# Patient Record
Sex: Male | Born: 1995 | Race: Black or African American | Hispanic: No | Marital: Single
Health system: Southern US, Community
[De-identification: ages and names within clinical notes are randomized; demographics above are authoritative.]

---

## 2021-02-02 ENCOUNTER — Emergency Department: Payer: Self-pay

## 2021-02-02 ENCOUNTER — Emergency Department
Admission: EM | Admit: 2021-02-02 | Discharge: 2021-02-02 | Disposition: A | Discharge status: Home / Self Care | Payer: Self-pay | Attending: Emergency Medicine | Admitting: Emergency Medicine

## 2021-02-02 ENCOUNTER — Other Ambulatory Visit: Payer: Self-pay

## 2021-02-02 DIAGNOSIS — R079 Chest pain, unspecified: Secondary | ICD-10-CM

## 2021-02-02 DIAGNOSIS — R7989 Other specified abnormal findings of blood chemistry: Secondary | ICD-10-CM | POA: Insufficient documentation

## 2021-02-02 DIAGNOSIS — R0789 Other chest pain: Secondary | ICD-10-CM | POA: Insufficient documentation

## 2021-02-02 DIAGNOSIS — R0602 Shortness of breath: Secondary | ICD-10-CM | POA: Insufficient documentation

## 2021-02-02 DIAGNOSIS — Z20822 Contact with and (suspected) exposure to covid-19: Secondary | ICD-10-CM | POA: Insufficient documentation

## 2021-02-02 LAB — BASIC METABOLIC PANEL
Anion gap: 12 (ref 5–15)
BUN: 10 mg/dL (ref 6–20)
CO2: 23 mmol/L (ref 22–32)
Calcium: 9.5 mg/dL (ref 8.9–10.3)
Chloride: 108 mmol/L (ref 98–111)
Creatinine, Ser: 1.26 mg/dL — ABNORMAL HIGH (ref 0.61–1.24)
GFR, Estimated: >60 mL/min (ref >60)
Glucose, Bld: 114 mg/dL — ABNORMAL HIGH (ref 70–99)
Potassium: 3.7 mmol/L (ref 3.5–5.1)
Sodium: 143 mmol/L (ref 135–145)

## 2021-02-02 LAB — TROPONIN I (HIGH SENSITIVITY)
Troponin I (High Sensitivity): 40 ng/L — ABNORMAL HIGH (ref <18)
Troponin I (High Sensitivity): 43 ng/L — ABNORMAL HIGH (ref <18)
Troponin I (High Sensitivity): 44 ng/L — ABNORMAL HIGH (ref <18)

## 2021-02-02 LAB — CBC
HCT: 42.5 % (ref 39.0–52.0)
Hemoglobin: 13.1 g/dL (ref 13.0–17.0)
MCH: 22.8 pg — ABNORMAL LOW (ref 26.0–34.0)
MCHC: 30.8 g/dL (ref 30.0–36.0)
MCV: 74 fL — ABNORMAL LOW (ref 80.0–100.0)
Platelets: 280 10*3/uL (ref 150–400)
RBC: 5.74 MIL/uL (ref 4.22–5.81)
RDW: 16.3 % — ABNORMAL HIGH (ref 11.5–15.5)
WBC: 6.4 10*3/uL (ref 4.0–10.5)
nRBC: 0 % (ref 0.0–0.2)

## 2021-02-02 LAB — RESP PANEL BY RT-PCR (FLU A&B, COVID) ARPGX2
Influenza A by PCR: NEGATIVE
Influenza B by PCR: NEGATIVE
SARS Coronavirus 2 by RT PCR: NEGATIVE

## 2021-02-02 MED ORDER — IOHEXOL 350 MG/ML SOLN
75.0000 mL | Freq: Once | INTRAVENOUS | Status: AC | PRN
  Administered 2021-02-02: 75 mL via INTRAVENOUS

## 2021-02-02 MED ORDER — ASPIRIN 81 MG PO CHEW
324.0000 mg | CHEWABLE_TABLET | Freq: Once | ORAL | Status: AC
  Administered 2021-02-02: 324 mg via ORAL
  Filled 2021-02-02: qty 4

## 2021-02-02 NOTE — ED Triage Notes (Signed)
Pt to ED via EMS from home, pt states aprox 1 hour ago he began to have chest pain on the left side that was sharp when he took a deep breath. Pt states he was also somewhat short of breath. Pt denies cardiac hx

## 2021-02-02 NOTE — ED Notes (Signed)
Morning assessment: pt rested comfortably overnight, denies current chest pain, c/o slight SOB and SPO2 remains 99-100%. RR: 12-16 bpm. Pt reports some occasional coughing overnight and this am. Pt ate [place] sandwich/food tray provided overnight.

## 2021-02-02 NOTE — ED Provider Notes (Signed)
-----------------------------------------   9:54 AM on 02/02/2021 ----------------------------------------- Patient's troponin is largely unchanged once again.  I spoke to cardiology Dr. Azucena Cecil who will follow up with the patient as an outpatient.  I have given the patient referral phone numbers that he is to call to arrange the appointment.  Patient's COVID/flu is negative.  Overall patient appears well.  Denies any symptoms currently.   Minna Antis, MD 02/02/21 [phone]

## 2021-02-02 NOTE — Discharge Instructions (Signed)
As we discussed please call the number provided to arrange a cardiology follow-up appointment.  Return to the emergency department for any return of/worsening chest pain, trouble breathing, or any other symptom personally concerning to yourself.

## 2021-02-02 NOTE — ED Provider Notes (Signed)
[facility] Emergency Department Provider Note ____________________________________________   Event Date/Time   First MD Initiated Contact with Patient 02/02/21 [phone]     (approximate)  I have reviewed the triage vital signs and the nursing notes.  HISTORY  Chief Complaint Chest Pain   HPI Shawn Horn is a 25 y.o. malewho presents to the ED for evaluation of chest pain.   Chart review indicates ED visit at a [facility] about 3 weeks ago for chest pain, benign work-up and discharged with NSAIDs.  Patient presents to the ED for evaluation of about 24 hours of intermittent chest pain.  He reports left-sided chest pain that is worse with inspiration and reports feeling mild shortness of breath associated with this, because he cannot take deep breaths due to the pain.  Denies any cough, productive cough, fever, syncopal episodes, abdominal pain, emesis or trauma.  Denies rash or skin changes.  Reports no chest pain right now.  Patient reports that he is undomiciled.  Denies recreational drug use or IVDU.  History reviewed. No pertinent past medical history.  There are no problems to display for this patient.   History reviewed. No pertinent surgical history.  Prior to Admission medications   Not on File    Allergies Patient has no known allergies.  No family history on file.  Social History    Review of Systems  Constitutional: No fever/chills Eyes: No visual changes. ENT: No sore throat. Cardiovascular: Positive for chest pain. Respiratory: Positive for shortness of breath. Gastrointestinal: No abdominal pain.  No nausea, no vomiting.  No diarrhea.  No constipation. Genitourinary: Negative for dysuria. Musculoskeletal: Negative for back pain. Skin: Negative for rash. Neurological: Negative for headaches, focal weakness or numbness.  ____________________________________________   PHYSICAL EXAM:  VITAL SIGNS: Vitals:   02/02/21  0539 02/02/21 0600  BP: 109/73 120/75  Pulse: 82 80  Resp: 14 14  Temp:    SpO2: 98% 99%    Constitutional: Alert and oriented. Well appearing and in no acute distress. Eyes: Conjunctivae are normal. PERRL. EOMI. Head: Atraumatic. Nose: No congestion/rhinnorhea. Mouth/Throat: Mucous membranes are moist.  Oropharynx non-erythematous. Neck: No stridor. No cervical spine tenderness to palpation. Cardiovascular: Normal rate, regular rhythm. Grossly normal heart sounds.  Good peripheral circulation. Respiratory: Normal respiratory effort.  No retractions. Lungs CTAB. Gastrointestinal: Soft , nondistended, nontender to palpation. No CVA tenderness. Musculoskeletal: No lower extremity tenderness nor edema.  No joint effusions. No signs of acute trauma. Chest pain is not reproducible on palpation.  No skin changes or signs of trauma. Neurologic:  Normal speech and language. No gross focal neurologic deficits are appreciated. No gait instability noted. Skin:  Skin is warm, dry and intact. No rash noted. Psychiatric: Mood and affect are normal. Speech and behavior are normal. ____________________________________________   LABS (all labs ordered are listed, but only abnormal results are displayed)  Labs Reviewed  BASIC METABOLIC PANEL - Abnormal; Notable for the following components:      Result Value   Glucose, Bld 114 (*)    Creatinine, Ser 1.26 (*)    All other components within normal limits  CBC - Abnormal; Notable for the following components:   MCV 74.0 (*)    MCH 22.8 (*)    RDW 16.3 (*)    All other components within normal limits  TROPONIN I (HIGH SENSITIVITY) - Abnormal; Notable for the following components:   Troponin I (High Sensitivity) 40 (*)    All other components within normal  limits  TROPONIN I (HIGH SENSITIVITY) - Abnormal; Notable for the following components:   Troponin I (High Sensitivity) 43 (*)    All other components within normal limits    ____________________________________________  12 Lead EKG  Sinus rhythm, rate of 90 bpm.  Normal axis and intervals.  Stigmata of LVH.  Nonspecific ST changes laterally and inferiorly without STEMI.  No comparison. ____________________________________________  RADIOLOGY  ED MD interpretation:  CXR reviewed by me without evidence of acute cardiopulmonary pathology.  Official radiology report(s): DG Chest 2 View  Result Date: 02/02/2021 CLINICAL DATA:  Chest pain on the left EXAM: CHEST - 2 VIEW COMPARISON:  None. FINDINGS: The heart size and mediastinal contours are within normal limits. Both lungs are clear. The visualized skeletal structures are unremarkable. IMPRESSION: No active cardiopulmonary disease. Electronically Signed   By: Tiburcio Pea M.D.   On: 02/02/2021 04:08   CT Angio Chest PE W and/or Wo Contrast  Result Date: 02/02/2021 CLINICAL DATA:  Chest pain, pleuritic EXAM: CT ANGIOGRAPHY CHEST WITH CONTRAST TECHNIQUE: Multidetector CT imaging of the chest was performed using the standard protocol during bolus administration of intravenous contrast. Multiplanar CT image reconstructions and MIPs were obtained to evaluate the vascular anatomy. CONTRAST:  48mL OMNIPAQUE IOHEXOL 350 MG/ML SOLN COMPARISON:  None. FINDINGS: Cardiovascular: Satisfactory opacification of the pulmonary arteries to the segmental level. No evidence of pulmonary embolism. Normal heart size. No pericardial effusion. Mediastinum/Nodes: Residual thymus with normal size and shape for age. No adenopathy or mass Lungs/Pleura: Cavitary nodule in the left lower lobe measuring 6 mm, solitary. Upper Abdomen: Negative Musculoskeletal: Gynecomastia.  Negative for fracture or bone lesion Review of the MIP images confirms the above findings. IMPRESSION: 1. Negative for pulmonary embolism. 2. 6 mm cavitary nodule in the left lower lobe, nonspecific in isolation. Fleischner follow-up criteria do not apply to patients of this  age. Fortunately, in retrospect a nodule is seen in this location on preceding chest x-ray, recommend radiographic follow-up. Electronically Signed   By: Tiburcio Pea M.D.   On: 02/02/2021 06:38    ____________________________________________   PROCEDURES and INTERVENTIONS  Procedure(s) performed (including Critical Care):  .1-3 Lead EKG Interpretation Performed by: Delton Prairie, MD Authorized by: Delton Prairie, MD     Interpretation: normal     ECG rate:  76   ECG rate assessment: normal     Rhythm: sinus rhythm     Ectopy: none     Conduction: normal    Medications  aspirin chewable tablet 324 mg (324 mg Oral Given 02/02/21 0604)  iohexol (OMNIPAQUE) 350 MG/ML injection 75 mL (75 mLs Intravenous Contrast Given 02/02/21 0621)    ____________________________________________   MDM / ED COURSE   Undomiciled 25 year old male presents to the ED with atypical chest pains and marginally elevated troponins.  Normal vitals on room air.  He looks clinically well with a normal examination.  No signs of trauma, neurologic or vascular deficits.  Unable to reproduce chest pain on palpation.  His EKG has nonspecific ST changes laterally and inferiorly, but no comparison.  Certainly no STEMI.  Basic labs are unremarkable, but his troponin is slightly elevated with no comparison in our system.  Second troponin is essentially flat.  CXR unremarkable and CTA chest without evidence of acute PE.  Has no chest pain in the ED.  Due to his surprisingly elevated troponin, we will send for third to better evaluate any significant changes or trend.  If this is normal or flat, then anticipate  he be suitable for outpatient management.  Patient signed out to oncoming provider.  Clinical Course as of 02/02/21 0656  Wed Feb 02, 2021  0156 Reassessed.  Patient reports feeling better.  Has tolerated a full meal tray without abdominal pain or emesis.  We discussed reassuring CT imaging and basic labs.  We discussed  a third troponin to ensure no significant changes.  He is agreeable. [DS]    Clinical Course User Index [DS] Delton Prairie, MD    ____________________________________________   FINAL CLINICAL IMPRESSION(S) / ED DIAGNOSES  Final diagnoses:  Other chest pain     ED Discharge Orders     None        Shawn Horn   Note:  This document was prepared using Dragon voice recognition software and may include unintentional dictation errors.    Delton Prairie, MD 02/02/21 [phone]

## 2021-02-02 NOTE — ED Notes (Signed)
Pt given sandwich tray 

## 2021-05-13 ENCOUNTER — Emergency Department (HOSPITAL_COMMUNITY)
Admission: EM | Admit: 2021-05-13 | Discharge: 2021-05-14 | Disposition: A | Discharge status: Home / Self Care | Payer: Commercial Managed Care - PPO | Attending: Emergency Medicine | Admitting: Emergency Medicine

## 2021-05-13 ENCOUNTER — Other Ambulatory Visit: Payer: Self-pay

## 2021-05-13 ENCOUNTER — Encounter (HOSPITAL_COMMUNITY): Payer: Self-pay

## 2021-05-13 DIAGNOSIS — F10129 Alcohol abuse with intoxication, unspecified: Secondary | ICD-10-CM | POA: Insufficient documentation

## 2021-05-13 DIAGNOSIS — Z20822 Contact with and (suspected) exposure to covid-19: Secondary | ICD-10-CM | POA: Diagnosis not present

## 2021-05-13 DIAGNOSIS — R45851 Suicidal ideations: Secondary | ICD-10-CM | POA: Diagnosis not present

## 2021-05-13 DIAGNOSIS — F10920 Alcohol use, unspecified with intoxication, uncomplicated: Secondary | ICD-10-CM

## 2021-05-13 LAB — CBC WITH DIFFERENTIAL/PLATELET
Abs Immature Granulocytes: 0.03 10*3/uL (ref 0.00–0.07)
Basophils Absolute: 0 10*3/uL (ref 0.0–0.1)
Basophils Relative: 1 %
Eosinophils Absolute: 0.3 10*3/uL (ref 0.0–0.5)
Eosinophils Relative: 5 %
HCT: 44.6 % (ref 39.0–52.0)
Hemoglobin: 13.9 g/dL (ref 13.0–17.0)
Immature Granulocytes: 1 %
Lymphocytes Relative: 46 %
Lymphs Abs: 2.9 10*3/uL (ref 0.7–4.0)
MCH: 22.7 pg — ABNORMAL LOW (ref 26.0–34.0)
MCHC: 31.2 g/dL (ref 30.0–36.0)
MCV: 72.8 fL — ABNORMAL LOW (ref 80.0–100.0)
Monocytes Absolute: 0.2 10*3/uL (ref 0.1–1.0)
Monocytes Relative: 3 %
Neutro Abs: 2.7 10*3/uL (ref 1.7–7.7)
Neutrophils Relative %: 44 %
Platelets: 274 10*3/uL (ref 150–400)
RBC: 6.13 MIL/uL — ABNORMAL HIGH (ref 4.22–5.81)
RDW: 16.6 % — ABNORMAL HIGH (ref 11.5–15.5)
WBC: 6.1 10*3/uL (ref 4.0–10.5)
nRBC: 0 % (ref 0.0–0.2)

## 2021-05-13 LAB — RAPID URINE DRUG SCREEN, HOSP PERFORMED
Amphetamines: NOT DETECTED
Barbiturates: NOT DETECTED
Benzodiazepines: NOT DETECTED
Cocaine: NOT DETECTED
Opiates: NOT DETECTED
Tetrahydrocannabinol: NOT DETECTED

## 2021-05-13 LAB — COMPREHENSIVE METABOLIC PANEL
ALT: 51 U/L — ABNORMAL HIGH (ref 0–44)
AST: 42 U/L — ABNORMAL HIGH (ref 15–41)
Albumin: 5.4 g/dL — ABNORMAL HIGH (ref 3.5–5.0)
Alkaline Phosphatase: 74 U/L (ref 38–126)
Anion gap: 11 (ref 5–15)
BUN: 9 mg/dL (ref 6–20)
CO2: 26 mmol/L (ref 22–32)
Calcium: 9.9 mg/dL (ref 8.9–10.3)
Chloride: 102 mmol/L (ref 98–111)
Creatinine, Ser: 1.05 mg/dL (ref 0.61–1.24)
GFR, Estimated: >60 mL/min (ref >60)
Glucose, Bld: 112 mg/dL — ABNORMAL HIGH (ref 70–99)
Potassium: 3.8 mmol/L (ref 3.5–5.1)
Sodium: 139 mmol/L (ref 135–145)
Total Bilirubin: 0.6 mg/dL (ref 0.3–1.2)
Total Protein: 9.2 g/dL — ABNORMAL HIGH (ref 6.5–8.1)

## 2021-05-13 LAB — ETHANOL: Alcohol, Ethyl (B): 328 mg/dL (ref <10)

## 2021-05-13 LAB — ACETAMINOPHEN LEVEL: Acetaminophen (Tylenol), Serum: <10 ug/mL — ABNORMAL LOW (ref 10–30)

## 2021-05-13 LAB — SALICYLATE LEVEL: Salicylate Lvl: <7 mg/dL — ABNORMAL LOW (ref 7.0–30.0)

## 2021-05-13 NOTE — ED Notes (Signed)
Date and time results received: 05/13/21 2258  Test: ETOH Critical Value: 328  Name of Provider Notified: Abigail  Orders Received? Or Actions Taken?: N/A

## 2021-05-13 NOTE — ED Notes (Signed)
Patient has 1 patient belonging bag and 1 back pack. Patient changed out into burgandy scrubs.

## 2021-05-13 NOTE — ED Provider Triage Note (Signed)
Emergency Medicine Provider Triage Evaluation Note  Shawn Horn , a 26 y.o. male  was evaluated in triage.  Pt complains of SI. No plan. Denies HI. Admits to some hallucinations. Patient admits to drinking 2 beers tonight. No drugs. Per patient he was recently on Prozac, but stopped taking it and is requesting to be put back on it.  Review of Systems  Positive: SI Negative: CP  Physical Exam  Ht 5\' 9"  (1.753 m)    Wt 77 kg    BMI 25.07 kg/m  Gen:   Awake, no distress   Resp:  Normal effort  MSK:   Moves extremities without difficulty  Other:    Medical Decision Making  Medically screening exam initiated at 9:59 PM.  Appropriate orders placed.  Shawn Horn was informed that the remainder of the evaluation will be completed by another provider, this initial triage assessment does not replace that evaluation, and the importance of remaining in the ED until their evaluation is complete.  Medical clearance labs   UTMLY 05/13/21 2200

## 2021-05-13 NOTE — ED Triage Notes (Signed)
[facility]. Patient stopped psych meds 2 weeks ago. Came from [place] today and has no place to stay. Patient is HI/SI did not mention any plans. Only remembers one medication being prozac, cannot remember the other 2. Patient stopped taking them because he did not like the way it made him feel. Patient also took 2 beers.

## 2021-05-14 LAB — RESP PANEL BY RT-PCR (FLU A&B, COVID) ARPGX2
Influenza A by PCR: NEGATIVE
Influenza B by PCR: NEGATIVE
SARS Coronavirus 2 by RT PCR: NEGATIVE

## 2021-05-14 MED ORDER — ZOLPIDEM TARTRATE 5 MG PO TABS: 5.0000 mg | ORAL_TABLET | Freq: Every evening | ORAL | Status: DC | PRN

## 2021-05-14 MED ORDER — ALUM & MAG HYDROXIDE-SIMETH 200-200-20 MG/5ML PO SUSP: 30.0000 mL | Freq: Four times a day (QID) | ORAL | Status: DC | PRN

## 2021-05-14 MED ORDER — BENZTROPINE MESYLATE 1 MG PO TABS
1.0000 mg | ORAL_TABLET | Freq: Four times a day (QID) | ORAL | Status: DC | PRN
  Filled 2021-05-14: qty 1

## 2021-05-14 MED ORDER — HALOPERIDOL 5 MG PO TABS
5.0000 mg | ORAL_TABLET | Freq: Four times a day (QID) | ORAL | Status: DC | PRN
  Filled 2021-05-14: qty 1

## 2021-05-14 MED ORDER — ACETAMINOPHEN 325 MG PO TABS: 650.0000 mg | ORAL_TABLET | ORAL | Status: DC | PRN

## 2021-05-14 MED ORDER — ONDANSETRON HCL 4 MG PO TABS: 4.0000 mg | ORAL_TABLET | Freq: Three times a day (TID) | ORAL | Status: DC | PRN

## 2021-05-14 NOTE — ED Provider Notes (Signed)
[facility] DEPT Provider Note   CSN: [id number] Arrival date & time: 05/13/21  2152     History  Chief Complaint  Patient presents with   Suicidal   Homicidal    Shawn Horn is a 26 y.o. male.  Presented to the emergency room with concern for suicidal ideation, alcohol intoxication.  Patient states that he over the past couple weeks has had some suicidal thoughts.  No specific plan, states that he at present does not feel suicidal.  In triage he had endorsed hallucinations but currently he denies any hallucinations.  States that he did not consume any other substances besides alcohol.  Reports that a few weeks ago he stopped taking his regular medication, states that he had previously been on Prozac.  He denies any medical complaints at present.  HPI     Home Medications Prior to Admission medications   Not on File      Allergies    Patient has no known allergies.    Review of Systems   Review of Systems  Constitutional:  Negative for chills and fever.  HENT:  Negative for ear pain and sore throat.   Eyes:  Negative for pain and visual disturbance.  Respiratory:  Negative for cough and shortness of breath.   Cardiovascular:  Negative for chest pain and palpitations.  Gastrointestinal:  Negative for abdominal pain and vomiting.  Genitourinary:  Negative for dysuria and hematuria.  Musculoskeletal:  Negative for arthralgias and back pain.  Skin:  Negative for color change and rash.  Neurological:  Negative for seizures and syncope.  Psychiatric/Behavioral:  Positive for suicidal ideas.   All other systems reviewed and are negative.  Physical Exam Updated Vital Signs BP 120/82 (BP Location: Right Arm)    Pulse 98    Temp (!) 97.3 F (36.3 C) (Oral)    Resp 18    Ht 5\' 9"  (1.753 m)    Wt 77 kg    SpO2 100%    BMI 25.07 kg/m  Physical Exam Vitals and nursing note reviewed.  Constitutional:      General: He is not in acute distress.     Appearance: He is well-developed.  HENT:     Head: Normocephalic and atraumatic.  Eyes:     Conjunctiva/sclera: Conjunctivae normal.  Cardiovascular:     Rate and Rhythm: Normal rate and regular rhythm.     Heart sounds: No murmur heard. Pulmonary:     Effort: Pulmonary effort is normal. No respiratory distress.     Breath sounds: Normal breath sounds.  Abdominal:     Palpations: Abdomen is soft.     Tenderness: There is no abdominal tenderness.  Musculoskeletal:        General: No swelling.     Cervical back: Neck supple.  Skin:    General: Skin is warm and dry.     Capillary Refill: Capillary refill takes less than 2 seconds.  Neurological:     Mental Status: He is alert.     Comments: Alert, answering questions appropriately  Psychiatric:     Comments: Slightly flattened affect, no ongoing SI or HI at present    ED Results / Procedures / Treatments   Labs (all labs ordered are listed, but only abnormal results are displayed) Labs Reviewed  COMPREHENSIVE METABOLIC PANEL - Abnormal; Notable for the following components:      Result Value   Glucose, Bld 112 (*)    Total Protein 9.2 (*)  Albumin 5.4 (*)    AST 42 (*)    ALT 51 (*)    All other components within normal limits  ETHANOL - Abnormal; Notable for the following components:   Alcohol, Ethyl (B) 328 (*)    All other components within normal limits  CBC WITH DIFFERENTIAL/PLATELET - Abnormal; Notable for the following components:   RBC 6.13 (*)    MCV 72.8 (*)    MCH 22.7 (*)    RDW 16.6 (*)    All other components within normal limits  SALICYLATE LEVEL - Abnormal; Notable for the following components:   Salicylate Lvl [id number] (*)    All other components within normal limits  ACETAMINOPHEN LEVEL - Abnormal; Notable for the following components:   Acetaminophen (Tylenol), Serum <10 (*)    All other components within normal limits  RESP PANEL BY RT-PCR (FLU A&B, COVID) ARPGX2  RAPID URINE DRUG SCREEN, HOSP  PERFORMED    EKG None  Radiology No results found.  Procedures Procedures    Medications Ordered in ED Medications  zolpidem (AMBIEN) tablet 5 mg (has no administration in time range)  ondansetron (ZOFRAN) tablet 4 mg (has no administration in time range)  alum & mag hydroxide-simeth (MAALOX/MYLANTA) 200-200-20 MG/5ML suspension 30 mL (has no administration in time range)  acetaminophen (TYLENOL) tablet 650 mg (has no administration in time range)  haloperidol (HALDOL) tablet 5 mg (has no administration in time range)    And  benztropine (COGENTIN) tablet 1 mg (has no administration in time range)    ED Course/ Medical Decision Making/ A&P                           Medical Decision Making Risk OTC drugs. Prescription drug management.   26 year old male presenting to ER with concern for SI, alcohol intoxication.  Blood work reviewed, noted to be profoundly intoxicated.  When I evaluated patient he was alert and answering questions appropriately.  He endorsed SI over the past 2 weeks though currently not suicidal or homicidal.  no electrolyte disturbance, very slight elevation in AST and ALT.  No leukocytosis, no anemia, negative UDS.  Due to reported SI, will ask TTS to evaluate.  Placed orders for psych hold.  Patient is medically stable for psychiatric evaluation at this time.        Final Clinical Impression(s) / ED Diagnoses Final diagnoses:  Suicidal ideations  Alcoholic intoxication without complication [facility])    Rx / DC Orders ED Discharge Orders     None         Lucrezia Starch, MD 05/14/21 [id number]

## 2021-05-14 NOTE — Progress Notes (Addendum)
CSW provided the following resources for the patient to utilize upon discharge. It was reported by TTS that the patient is interested in detox and further substance abuse treatment. CSW reached out to [facility]  at [address] [place], [place] [phone]; spoke with Marylene Land who advised that the patient can present to the facility for treatment.   Substance Abuse Resources   [facility] - Admissions are currently completed Monday through Friday at 8am; both appointments and walk-ins are accepted.  Any individual that is a [facility] resident may present for a substance abuse screening and assessment for admission.  A person may be referred by numerous sources or self-refer.   Potential clients will be screened for medical necessity and appropriateness for the program.  Clients must meet criteria for high-intensity residential treatment services.  If clinically appropriate, a client will continue with the comprehensive clinical assessment and intake process, as well as enrollment in the [facility].  Address: [address] [place], [place] [address] Admin Hours: Mon-Fri 8AM to [facility] Center Hours: 24/7 Phone: [phone] Fax: [phone]  [facility] (Detox) Facility Based Crisis:  These are 3 locations for services: Please call before arrival:    [facility] Recovery Facility Based Crisis [facility])  Address: [address] [place], [place] [address] Phone: [phone]  [facility] Recovery Facility Based Crisis [facility]) Address: [address] [place], [place] [address] Phone#: [phone]  [facility] Recovery Facility Based Crisis [facility]) Address: [address] [place], [place] [address] Phone#: [phone]   [facility] ([facility]): (offers outpatient therapy and intensive outpatient substance abuse therapy).  [address], [place], [place] [address] Phone: [phone]  [facility] Address: [address] [place], [place] [address] Phone: [phone]  -The [facility] provides food, shelter and other programs and services to the homeless men of [place] through our [employer] program.  By offering safe shelter, three meals a day, clean clothing, Biblical counseling, financial planning, vocational training, GED/education and employment assistance, we've helped mend the shattered lives of many homeless men since opening in [place].  We have approximately 267 beds available, with a max of 312 beds including mats for emergency situations and currently house an average of 270 men a night.  Prospective Client Check-In Information Photo ID Required (State/ Out of State/ [facility]) - if photo ID is not available, clients are required to have a printout of a police/sheriff's criminal history report. Help out with chores around the [facility]. No sex offender of any type (pending, charged, registered and/or any other sex related offenses) will be permitted to check in. Must be willing to abide by all rules, regulations, and policies established by the [employer]. The following will be provided - shelter, food, clothing, and biblical counseling. If you or someone you know is in need of assistance at our [facility] shelter in [place], [place], please call [phone]. [phone].  Women Shelter for [employer] hours are Monday-Friday only.   [facility]:   Phone: [phone]  The [facility] SA Intensive Outpatient Program [facility]) means structured individual and group addiction activities and services that are provided at an outpatient program designed to assist adult and adolescent consumers to begin recovery and learn skills for recovery maintenance. The [employer] SAIOP program is offered at least 3 hours a day, 3 days a week. SAIOP services shall include a structured program  consisting of, but not limited  to, the following services: Individual counseling and support; Group counseling and support; Family counseling, training or support; Biochemical assays to identify recent drug use (e.g., urine drug screens); Strategies for relapse prevention to include community and social support systems in treatment; Life skills; Crisis contingency planning; Disease Management; and Treatment support activities that have been adapted or specifically designed for persons with physical disabilities, or persons with co-occurring disorders of mental illness and substance abuse/dependence or mental retardation/developmental disability and substance abuse/dependence.  Phone: [phone]   [facility])  Address: [address] [place], [place], [place] [address] Phone: [phone]   [facility] Address: [address], [place], [place] [address] Phone: [phone]  - a combination of group and individual sessions to meet the participants needs. This allows participants to engage in treatment and remain involved in their home and work life. - Transitional housing places program participants in a supportive living environment while they complete a treatment program and work to secure independent housing. - The Substance Abuse Intensive Outpatient Treatment Program at [employer] consists of structured group sessions and individual sessions that are designed to teach participants early recovery and relapse prevention skills. -[facility] works with the [employer] to provide a housing and treatment program for homeless veterans.   Residential Company secretary, [employer].   Address: [address] [place], [place] [address] Phone#: [phone]   : Referrals to RTSA facilities can be made by [employer] and [facility].  Referrals are also accepted from physicians, private providers, hospital emergency rooms, family members, or any person who  has knowledge of someone in the need of our services.  The [facility] will also offer the following outpatient services: (Monday through Friday 8am-5pm)   Partial Hospitalization Program (PHP) Substance Abuse Intensive Outpatient Program (SA-IOP) Group Therapy Medication Management Peer Living Room We also provide (24/7):  Assessments: Our mental health clinician and providers will conduct a focused mental health evaluation, assessing for immediate safety concerns and further mental health needs. Referral: Our team will provide resources and help connect to community based mental health treatment, when indicated, including psychotherapy, psychiatry, and other specialized behavioral health or substance use disorder services (for those not already in treatment). Transitional Care: Our team providers in person bridging and/or telephonic follow-up during the patient's transition to outpatient services.   The [facility] 24-Hour Call Center: [phone] Behavioral Health Crisis Line: [phone]   Crissie Reese, MSW, LCSW-A, [place] Phone: [phone] Disposition/TOC

## 2021-05-14 NOTE — Discharge Instructions (Signed)
Substance Abuse Resources ? ? ?[facility] ?- Admissions are currently completed Monday through Friday at 8am; both appointments and walk-ins are accepted.  Any individual that is a [place] resident may present for a substance abuse screening and assessment for admission.  A person may be referred by numerous sources or self-refer.   Potential clients will be screened for medical necessity and appropriateness for the program.  Clients must meet criteria for high-intensity residential treatment services.  If clinically appropriate, a client will continue with the comprehensive clinical assessment and intake process, as well as enrollment in the [facility]. ? ?Address: [address] ?[place], [place] [address] ?Admin Hours: Mon-Fri 8AM to 5PM ?Center Hours: 24/7 ?Phone: [phone] ?Fax: [phone] ? ?[facility] (Detox) Facility Based Crisis:  ?These are 3 locations for services: Please call before arrival:   ? ?[facility] ([facility])  ?Address: [address] [place], [place] [address] ?Phone: [phone] ? ?[facility] ([facility]) ?Address: [address], [place], [place] [address] ?Phone#: [phone] ? ?[facility] ([facility]) ?Address: [address], [place], [place] [address] ?Phone#: [phone] ? ? ?[facility] ([facility]): (offers outpatient therapy and intensive outpatient substance abuse therapy).  ?[address], [place], [place] [address] ?Phone: [phone] ? ?[facility]Men[facility] Division ?Address: [address] [place], [place] [address] ?Phone: [phone] ? ?-The [facility] provides food, shelter and other programs and services to the homeless men of [place] through our men's program. ? ?By offering safe shelter, three meals a day, clean clothing, Biblical counseling, financial planning, vocational training, GED/education and  employment assistance, we've helped mend the shattered lives of many homeless men since opening in 1974. ? ?We have approximately 267 beds available, with a max of 312 beds including mats for emergency situations and currently house an average of 270 men a night. ? ?Prospective Client Check-In Information ?Photo ID Required (State/ Out of State/ [employer]) - if photo ID is not available, clients are required to have a printout of a police/sheriff's criminal history report. ?Help out with chores around the [facility]. ?No sex offender of any type (pending, charged, registered and/or any other sex related offenses) will be permitted to check in. ?Must be willing to abide by all rules, regulations, and policies established by the [facility]. ?The following will be provided - shelter, food, clothing, and biblical counseling. ?If you or someone you know is in need of assistance at our men's shelter in [place], [place], please call [phone]. [phone]. ? ?[facility] Shel[facility] for [facility] intake hours are Monday-Friday only.  ? ?[facility]: ? ? Phone: [phone] ? ?The [facility] ?SA Intensive Outpatient Program (SAIOP) means structured individual and group addiction activities and services that are provided at an outpatient program designed to assist adult and adolescent consumers to begin recovery and learn skills for recovery maintenance. The [employer] SAIOP program is offered at least 3 hours a day, 3 days a week. SAIOP services shall include a structured program consisting of, but not limited to, the following services: ?Individual counseling and support; Group counseling and support; Family counseling, training or support; Biochemical assays to identify recent drug use (e.g., urine drug screens); Strategies for relapse prevention to include community and social support systems in treatment; Life skills; Crisis contingency planning; Disease Management; and Treatment  support activities that have been adapted or   specifically designed for persons with physical disabilities, or persons with co-occurring disorders of mental illness and substance abuse/dependence or mental retardation/developmental disability and substance abuse/dependence.  Phone: [phone]   [facility])  Address: [address] [place], [place], [place] [address] Phone: [phone]   [facility] Address: [address] [place], [place] [address] Phone: [phone]  - a combination of group and individual sessions to meet the participants needs. This allows participants to engage in treatment and remain involved in their home and work life. - Transitional housing places program participants in a supportive living environment while they complete a treatment program and work to secure independent housing. - The Substance Abuse Intensive Outpatient Treatment Program at [employer] consists of structured group sessions and individual sessions that are designed to teach participants early recovery and relapse prevention skills. -[facility] works with the [employer] to provide a housing and treatment program for homeless veterans.   Residential Company secretary, [employer].   Address: [address] [place], [place] [address] Phone#: [phone]   : Referrals to RTSA facilities can be made by [facility] and [facility].  Referrals are also accepted from physicians, private providers, hospital emergency rooms, family members, or any person who has knowledge of someone in the need of our services.  The [facility] will also offer the following outpatient services: (Monday through Friday 8am-5pm)   Partial Hospitalization Program (PHP) Substance Abuse Intensive Outpatient Program (SA-IOP) Group Therapy Medication Management Peer Living Room We also provide (24/7):  Assessments: Our mental health  clinician and providers will conduct a focused mental health evaluation, assessing for immediate safety concerns and further mental health needs. Referral: Our team will provide resources and help connect to community based mental health treatment, when indicated, including psychotherapy, psychiatry, and other specialized behavioral health or substance use disorder services (for those not already in treatment). Transitional Care: Our team providers in person bridging and/or telephonic follow-up during the patient's transition to outpatient services.   The [facility] 24-Hour Call Center: [phone] Behavioral Health Crisis Line: [phone]

## 2021-05-14 NOTE — BH Assessment (Signed)
Comprehensive Clinical Assessment (CCA) Note  05/14/2021 Shawn Horn [id number]  DISPOSITION: Patient per Vance Peper NP) does not meet inpatient criteria and has been cleared by psychiatry. SW to assist with referrals patient is requesting a long term program to assist with ongoing alcohol issues.   [facility] ED from 05/13/2021 in [facility] DEPT ED from 02/02/2021 in [place] No Risk No Risk      The patient demonstrates the following risk factors for suicide: Chronic risk factors for suicide include: N/A. Acute risk factors for suicide include: N/A. Protective factors for this patient include: coping skills. Considering these factors, the overall suicide risk at this point appears to be low. Patient is appropriate for outpatient follow up.   Patient is a 26 year old male that presents this date voluntary to [facility] requesting assistance with ongoing alcohol issues. Patient denies any S/I, H/I or AVH at the time of TTS assessment although per notes on arrival patient did voice that he had been having some suicidal thoughts last week but no intent or plan. Patient states he is originally from [place] although has been traveling to different areas attempting to find residential placement to assist with ongoing ETOH issues. Patient states he is also homeless. Patient reports he was diagnosed with depression and anxiety over a year ago while living in the [place] area and was prescribed Prozac for symptom management. Patient states he was receiving OP services from [facility] although ran out of that medication within the last month stating he was unable to return to that provider due to transportation issues. Patient reports that he recently found out about a  program in [facility] [employer]) although patient reports that on arrival they informed him that it would be two weeks or longer  before they had a available bed. Patient states after finding out that information he "became very depressed" and started having thoughts to self harm although reports he would never act on that. Patient states he consumes anywhere from 6 to 12 beers three to four times a week with last use prior to arrival when patient reported he "had 4 beers." Patient is requesting assistance with ongoing alcohol issues to be addressed in a residential setting.       EDP writes on arrival 2/17: Shawn Horn is a 26 y.o. male.  Presented to the emergency room with concern for suicidal ideation, alcohol intoxication.  Patient states that he over the past couple weeks has had some suicidal thoughts.  No specific plan, states that he at present does not feel suicidal.  In triage he had endorsed hallucinations but currently he denies any hallucinations.  States that he did not consume any other substances besides alcohol.  Reports that a few weeks ago he stopped taking his regular medication, states that he had previously been on Prozac.  Patient is alert and oriented x 5. Patient speaks in a normal voice with clear tone. Patient's memory appears to be intact with thoughts organized. Patient's mood is appropriate for current treatment setting.   Chief Complaint:  Chief Complaint  Patient presents with   Suicidal   Homicidal   Visit Diagnosis: Alcohol induced mood disorder    CCA Screening, Triage and Referral (STR)  Patient Reported Information How did you hear about [place]? Self  What Is the Reason for Your Visit/Call Today? Pt presents requesting assistance with ongoing ETOH issues  How Long Has This Been Causing You  Problems? > than 6 months  What Do You Feel Would Help You the Most Today? Alcohol or Drug Use Treatment   Have You Recently Had Any Thoughts About Hurting Yourself? No  Are You Planning to Commit Suicide/Harm Yourself At This time? No   Have you Recently Had Thoughts About [place]? No  Are You Planning to Harm Someone at This Time? No  Explanation: No data recorded  Have You Used Any Alcohol or Drugs in the Past 24 Hours? Yes  How Long Ago Did You Use Drugs or Alcohol? No data recorded What Did You Use and How Much? Pt reports consuming "4 to 5 beers" prior to arrival BAL was 328 on arrival   Do You Currently Have a Therapist/Psychiatrist? No  Name of Therapist/Psychiatrist: No data recorded  Have You Been Recently Discharged From Any Office Practice or Programs? No  Explanation of Discharge From Practice/Program: No data recorded    CCA Screening Triage Referral Assessment Type of Contact: Face-to-Face  Telemedicine Service Delivery:   Is this Initial or Reassessment? No data recorded Date Telepsych consult ordered in [facility]:  No data recorded Time Telepsych consult ordered in [facility]:  No data recorded Location of Assessment: [facility]  Provider Location: Other (comment) ([facility])   Collateral Involvement: None at this time   Does Patient Have a [place]? No data recorded Name and Contact of Legal Guardian: No data recorded If Minor and Not Living with Parent(s), Who has Custody? NA  Is CPS involved or ever been involved? Never  Is APS involved or ever been involved? Never   Patient Determined To Be At Risk for Harm To Self or Others Based on Review of Patient Reported Information or Presenting Complaint? No  Method: No data recorded Availability of Means: No data recorded Intent: No data recorded Notification Required: No data recorded Additional Information for Danger to Others Potential: No data recorded Additional Comments for Danger to Others Potential: No data recorded Are There Guns or Other Weapons in Your Home? No data recorded Types of Guns/Weapons: No data recorded Are These Weapons Safely Secured?                            No data recorded Who Could Verify You Are Able To Have These Secured: No data  recorded Do You Have any Outstanding Charges, Pending Court Dates, Parole/Probation? No data recorded Contacted To Inform of Risk of Harm To Self or Others: Other: Comment (NA)    Does Patient Present under Involuntary Commitment? No  IVC Papers Initial File Date: No data recorded  [place] of Residence: Other (Comment) ([place])   Patient Currently Receiving the Following Services: Not Receiving Services   Determination of Need: Routine (7 days)   Options For Referral: Outpatient Therapy     CCA Biopsychosocial Patient Reported Schizophrenia/Schizoaffective Diagnosis in Past: No   Strengths: Pt is willing to participate in treatment   Mental Health Symptoms Depression:   Change in energy/activity; Hopelessness   Duration of Depressive symptoms:  Duration of Depressive Symptoms: Greater than two weeks   Mania:   None   Anxiety:    Difficulty concentrating   Psychosis:   None   Duration of Psychotic symptoms:    Trauma:   None   Obsessions:   None   Compulsions:   None   Inattention:   None   Hyperactivity/Impulsivity:   None   Oppositional/Defiant Behaviors:  None   Emotional Irregularity:   Chronic feelings of emptiness   Other Mood/Personality Symptoms:   NA    Mental Status Exam Appearance and self-care  Stature:   Average   Weight:   Average weight   Clothing:   Neat/clean   Grooming:   Normal   Cosmetic use:   None   Posture/gait:   Normal   Motor activity:   Not Remarkable   Sensorium  Attention:   Normal   Concentration:   Normal   Orientation:   X5   Recall/memory:   Normal   Affect and Mood  Affect:   Appropriate   Mood:   Depressed   Relating  Eye contact:   Normal   Facial expression:   Responsive   Attitude toward examiner:   Cooperative   Thought and Language  Speech flow:  Clear and Coherent   Thought content:   Appropriate to Mood and Circumstances   Preoccupation:    None   Hallucinations:   None   Organization:  No data recorded  [employer] of Knowledge:   Fair   Intelligence:   Average   Abstraction:   Normal   Judgement:   Good   Reality Testing:   Adequate; Realistic   Insight:   Good   Decision Making:   Normal   Social Functioning  Social Maturity:   Responsible   Social Judgement:   Normal   Stress  Stressors:   Other (Comment) (Ongoing ETOH issues)   Coping Ability:   Overwhelmed   Skill Deficits:   None   Supports:   Usual     Religion: Religion/Spirituality Are You A Religious Person?: No  Leisure/Recreation: Leisure / Recreation Do You Have Hobbies?: No  Exercise/Diet: Exercise/Diet Do You Exercise?: No Have You Gained or Lost A Significant Amount of Weight in the Past Six Months?: No Do You Follow a Special Diet?: No Do You Have Any Trouble Sleeping?: Yes Explanation of Sleeping Difficulties: Pt states he self medicates with alcohol to sleep reporting 4 hours or less a night   CCA Employment/Education Employment/Work Situation: Employment / Work Situation Employment Situation: Unemployed Has Patient ever Been in Passenger transport manager?: No  Education: Education Is Patient Currently Attending School?: No Last Grade Completed: 12 Did You Nutritional therapist?: No Did You Have Any Difficulty At [employer]?: No   CCA Family/Childhood History Family and Relationship History: Family history Marital status: Single Does patient have children?: No  Childhood History:  Childhood History Did patient suffer any verbal/emotional/physical/sexual abuse as a child?: No Did patient suffer from severe childhood neglect?: No Has patient ever been sexually abused/assaulted/raped as an adolescent or adult?: No  Child/Adolescent Assessment:     CCA Substance Use Alcohol/Drug Use: Alcohol / Drug Use Pain Medications: See MAR Prescriptions: See MAR Over the Counter: See MAR History of alcohol  / drug use?: Yes Longest period of sobriety (when/how long): 1 year in 2019 Withdrawal Symptoms: Agitation, Weakness, Irritability Substance #1 Name of Substance 1: Alcohol 1 - Age of First Use: 17 1 - Amount (size/oz): Varies 1 - Frequency: 3 to 4 times a week 1 - Duration: Ongoing for the last year 1 - Last Use / Amount: prior to arrival pt reports consuming 4 to 6 beers BAL was 328 on arrival                       ASAM's:  Six Dimensions of Multidimensional Assessment  Dimension  1:  Acute Intoxication and/or Withdrawal Potential:   Dimension 1:  Description of individual's past and current experiences of substance use and withdrawal: 1  Dimension 2:  Biomedical Conditions and Complications:   Dimension 2:  Description of patient's biomedical conditions and  complications: 2  Dimension 3:  Emotional, Behavioral, or Cognitive Conditions and Complications:  Dimension 3:  Description of emotional, behavioral, or cognitive conditions and complications: 2  Dimension 4:  Readiness to Change:  Dimension 4:  Description of Readiness to Change criteria: 3  Dimension 5:  Relapse, Continued use, or Continued Problem Potential:  Dimension 5:  Relapse, continued use, or continued problem potential critiera description: 2  Dimension 6:  Recovery/Living Environment:  Dimension 6:  Recovery/Iiving environment criteria description: 2  ASAM Severity Score: ASAM's Severity Rating Score: 12  ASAM Recommended Level of Treatment:     Substance use Disorder (SUD) Substance Use Disorder (SUD)  Checklist Symptoms of Substance Use: Continued use despite having a persistent/recurrent physical/psychological problem caused/exacerbated by use  Recommendations for Services/Supports/Treatments:    Discharge Disposition:    DSM5 Diagnoses: There are no problems to display for this patient.    Referrals to Alternative Service(s): Referred to Alternative Service(s):   Place:   Date:   Time:     Referred to Alternative Service(s):   Place:   Date:   Time:    Referred to Alternative Service(s):   Place:   Date:   Time:    Referred to Alternative Service(s):   Place:   Date:   Time:     Mamie Nick, LCAS

## 2021-09-03 ENCOUNTER — Emergency Department
Admission: EM | Admit: 2021-09-03 | Discharge: 2021-09-06 | Disposition: A | Discharge status: Home / Self Care | Payer: Self-pay | Attending: Emergency Medicine | Admitting: Emergency Medicine

## 2021-09-03 ENCOUNTER — Other Ambulatory Visit: Payer: Self-pay

## 2021-09-03 ENCOUNTER — Emergency Department: Payer: Self-pay

## 2021-09-03 DIAGNOSIS — Z7901 Long term (current) use of anticoagulants: Secondary | ICD-10-CM | POA: Insufficient documentation

## 2021-09-03 DIAGNOSIS — G8921 Chronic pain due to trauma: Secondary | ICD-10-CM | POA: Insufficient documentation

## 2021-09-03 LAB — CBC WITH DIFFERENTIAL/PLATELET
Abs Immature Granulocytes: 0.01 10*3/uL (ref 0.00–0.07)
Basophils Absolute: 0 10*3/uL (ref 0.0–0.1)
Basophils Relative: 1 %
Eosinophils Absolute: 0.1 10*3/uL (ref 0.0–0.5)
Eosinophils Relative: 1 %
HCT: 35.3 % — ABNORMAL LOW (ref 39.0–52.0)
Hemoglobin: 11.4 g/dL — ABNORMAL LOW (ref 13.0–17.0)
Immature Granulocytes: 0 %
Lymphocytes Relative: 43 %
Lymphs Abs: 2.9 10*3/uL (ref 0.7–4.0)
MCH: 25.1 pg — ABNORMAL LOW (ref 26.0–34.0)
MCHC: 32.3 g/dL (ref 30.0–36.0)
MCV: 77.6 fL — ABNORMAL LOW (ref 80.0–100.0)
Monocytes Absolute: 0.5 10*3/uL (ref 0.1–1.0)
Monocytes Relative: 8 %
Neutro Abs: 3.2 10*3/uL (ref 1.7–7.7)
Neutrophils Relative %: 47 %
Platelets: 245 10*3/uL (ref 150–400)
RBC: 4.55 MIL/uL (ref 4.22–5.81)
RDW: 14 % (ref 11.5–15.5)
WBC: 6.7 10*3/uL (ref 4.0–10.5)
nRBC: 0 % (ref 0.0–0.2)

## 2021-09-03 LAB — BASIC METABOLIC PANEL
Anion gap: 10 (ref 5–15)
BUN: 11 mg/dL (ref 6–20)
CO2: 23 mmol/L (ref 22–32)
Calcium: 9.5 mg/dL (ref 8.9–10.3)
Chloride: 100 mmol/L (ref 98–111)
Creatinine, Ser: 0.54 mg/dL — ABNORMAL LOW (ref 0.61–1.24)
GFR, Estimated: >60 mL/min (ref >60)
Glucose, Bld: 102 mg/dL — ABNORMAL HIGH (ref 70–99)
Potassium: 3.7 mmol/L (ref 3.5–5.1)
Sodium: 133 mmol/L — ABNORMAL LOW (ref 135–145)

## 2021-09-03 MED ORDER — POLYETHYLENE GLYCOL 3350 17 G PO PACK
17.0000 g | PACK | Freq: Every day | ORAL | Status: DC
  Administered 2021-09-05 – 2021-09-06 (×2): 17 g via ORAL
  Filled 2021-09-03 (×4): qty 1

## 2021-09-03 MED ORDER — ZOLPIDEM TARTRATE 5 MG PO TABS
5.0000 mg | ORAL_TABLET | Freq: Every evening | ORAL | Status: DC | PRN
  Administered 2021-09-03 – 2021-09-05 (×3): 5 mg via ORAL
  Filled 2021-09-03 (×3): qty 1

## 2021-09-03 MED ORDER — PREGABALIN 75 MG PO CAPS
150.0000 mg | ORAL_CAPSULE | Freq: Two times a day (BID) | ORAL | Status: DC
  Administered 2021-09-03 – 2021-09-06 (×7): 150 mg via ORAL
  Filled 2021-09-03 (×7): qty 2

## 2021-09-03 MED ORDER — OXYCODONE-ACETAMINOPHEN 5-325 MG PO TABS
1.0000 | ORAL_TABLET | Freq: Once | ORAL | Status: AC
  Administered 2021-09-03: 1 via ORAL
  Filled 2021-09-03: qty 1

## 2021-09-03 MED ORDER — OXYCODONE HCL 5 MG PO TABS
10.0000 mg | ORAL_TABLET | Freq: Four times a day (QID) | ORAL | Status: DC | PRN
  Administered 2021-09-03 – 2021-09-06 (×10): 10 mg via ORAL
  Filled 2021-09-03 (×10): qty 2

## 2021-09-03 MED ORDER — LORATADINE 10 MG PO TABS
10.0000 mg | ORAL_TABLET | Freq: Every day | ORAL | Status: DC
  Administered 2021-09-03 – 2021-09-06 (×4): 10 mg via ORAL
  Filled 2021-09-03 (×4): qty 1

## 2021-09-03 MED ORDER — OXYCODONE-ACETAMINOPHEN 5-325 MG PO TABS
1.0000 | ORAL_TABLET | Freq: Three times a day (TID) | ORAL | Status: DC | PRN
  Administered 2021-09-03 (×2): 1 via ORAL
  Filled 2021-09-03 (×2): qty 1

## 2021-09-03 MED ORDER — DOCUSATE SODIUM 100 MG PO CAPS
100.0000 mg | ORAL_CAPSULE | Freq: Two times a day (BID) | ORAL | Status: DC
  Administered 2021-09-03 – 2021-09-06 (×6): 100 mg via ORAL
  Filled 2021-09-03 (×6): qty 1

## 2021-09-03 MED ORDER — METOPROLOL TARTRATE 50 MG PO TABS
50.0000 mg | ORAL_TABLET | Freq: Two times a day (BID) | ORAL | Status: DC
  Administered 2021-09-03 – 2021-09-06 (×7): 50 mg via ORAL
  Filled 2021-09-03 (×7): qty 1

## 2021-09-03 MED ORDER — ERYTHROMYCIN 5 MG/GM OP OINT
TOPICAL_OINTMENT | Freq: Two times a day (BID) | OPHTHALMIC | Status: DC
  Administered 2021-09-03 – 2021-09-05 (×6): 1 via OPHTHALMIC
  Filled 2021-09-03 (×4): qty 1

## 2021-09-03 MED ORDER — FAMOTIDINE 20 MG PO TABS
20.0000 mg | ORAL_TABLET | Freq: Two times a day (BID) | ORAL | Status: DC
  Administered 2021-09-03 – 2021-09-06 (×7): 20 mg via ORAL
  Filled 2021-09-03 (×7): qty 1

## 2021-09-03 MED ORDER — NAPROXEN 500 MG PO TABS
500.0000 mg | ORAL_TABLET | Freq: Two times a day (BID) | ORAL | Status: DC
  Administered 2021-09-03: 500 mg via ORAL
  Filled 2021-09-03: qty 1

## 2021-09-03 MED ORDER — ACETAMINOPHEN 325 MG PO TABS
650.0000 mg | ORAL_TABLET | Freq: Four times a day (QID) | ORAL | Status: DC | PRN
  Administered 2021-09-04 – 2021-09-06 (×3): 650 mg via ORAL
  Filled 2021-09-03 (×3): qty 2

## 2021-09-03 MED ORDER — APIXABAN 5 MG PO TABS
5.0000 mg | ORAL_TABLET | Freq: Two times a day (BID) | ORAL | Status: DC
  Administered 2021-09-04 – 2021-09-06 (×6): 5 mg via ORAL
  Filled 2021-09-03 (×6): qty 1

## 2021-09-03 MED ORDER — SENNA 8.6 MG PO TABS
1.0000 | ORAL_TABLET | Freq: Two times a day (BID) | ORAL | Status: DC
  Administered 2021-09-03 – 2021-09-06 (×7): 8.6 mg via ORAL
  Filled 2021-09-03 (×7): qty 1

## 2021-09-03 NOTE — ED Notes (Signed)
Pt placed on hospital bed at this time.  
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
## 2021-09-03 NOTE — ED Notes (Signed)
RN was contacted by Chrissy with APS about pt. RN provided her with information that was needed.

## 2021-09-03 NOTE — ED Notes (Signed)
The pt requested a urinal which was provided. The pt was able to sit up on the bed side to urinate.
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
## 2021-09-03 NOTE — ED Notes (Signed)
Pt given breakfast tray and beverage.  

## 2021-09-03 NOTE — ED Notes (Signed)
Mom angry at the fact she could not come back to the room with pt.  Pt expressed he did not want her in the room.  Mom asked "why is he being admitted"?  I stated he was not at this point.  He was just going to see the doctor.  She asked "For what"?  Pt stated "none of your business".  Mother states she needs to go home to sleep and she has other children.  Left a number Rene Kocher ([phone]) to call her with any questions.  When asked if we can call her when hes discharged for a ride home, mom states "I cant get him on the 3rd floor of our apartment and they will need to bring him back (EMS).  Mom left the lobby.
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
## 2021-09-03 NOTE — ED Notes (Signed)
Pt has room tv on and is playing game on personal phone.

## 2021-09-04 NOTE — ED Notes (Signed)
Pt given dinner tray, pt tolerating well. Pt states he does not need anything at this time.

## 2021-09-04 NOTE — ED Notes (Signed)
Pt provided a sandwich box, vanilla ice cream, and ginger ale

## 2021-09-04 NOTE — ED Notes (Signed)
Report received from Maureen, RN 

## 2021-09-04 NOTE — ED Provider Notes (Signed)
Today's Vitals   09/03/21 2200 09/03/21 2300 09/03/21 2320 09/04/21 0001  BP: 129/83 (!) 132/98    Pulse: 91 96    Resp:  16    Temp:      TempSrc:      SpO2: 98% 100%    Weight:      Height:      PainSc:   Asleep 7    Body mass index is 22.15 kg/m.   No acute events.  Awaiting social work disposition.   Darryle Dennie, Delice Bison, DO 09/04/21 [phone]

## 2021-09-04 NOTE — ED Notes (Addendum)
Pt alert, oriented, set up for lunch. Pt able to feed self and turn and position self. Pt using urinal to void.

## 2021-09-04 NOTE — ED Notes (Signed)
Patient's mother called to check on him. Writer provided an update and advised his mother that he is getting ready to be medicated and and go to sleep.

## 2021-09-05 ENCOUNTER — Emergency Department: Payer: Self-pay

## 2021-09-05 NOTE — ED Provider Notes (Signed)
I was asked about patient's weightbearing status.  On review of most recent orthopedic telemedicine note [facility] patient was to remain nonweightbearing in the left upper extremity and right lower extremity until repeat x-rays were done which were not done as an outpatient.  Pelvic x-ray here shows normal alignment of the SI joints status post ORIF of the right SI joint with orthopedic hardware well-seated old healed fracture of the superior and inferior pubic rami with a subtle age-indeterminate fracture along the superior left iliac crest.  I will order left forearm x-ray as well.  We will attempt to reach out to patient's orthopedist to further clarify weightbearing status.  I spoke with Raynelle Fanning one of the trauma nurses with Dr. Anner Crete office.  She notes that patient should be nonweightbearing still.  She will have Dr. Virgilio Belling review the imaging and let us know if this changes anything.   Georga Hacking, MD 09/07/21 [phone]

## 2021-09-05 NOTE — ED Notes (Signed)
Pt given meal tray.

## 2021-09-05 NOTE — ED Notes (Signed)
PT at bedside.
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
## 2021-09-05 NOTE — ED Notes (Signed)
Pt is sleeping, chest rise and fall observed. Siderails up, bed in low position, and call bell within reach. No needs at this time

## 2021-09-06 NOTE — ED Notes (Signed)
RN to bedside to answer call bell and introduce self to pt. Pt needs to go to the toilet. Pt transferred to wheelchair and moved to toilet and transferred to toilet on his own as well. Pt asking for medication.
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
## 2021-09-06 NOTE — ED Notes (Signed)
Pt dressed himself and waiting for transport.
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
## 2021-09-06 NOTE — ED Notes (Signed)
Rounding-Patient asleep, respirations noted, bed in lowest position with side rail up, call bell within reach 

## 2021-09-06 NOTE — TOC Transition Note (Signed)
Transition of Care [facility]) - CM/SW Discharge Note   Patient Details  Name: Nicholaos Schippers MRN: [id number] Date of Birth: 1996/02/05  Transition of Care [facility]) CM/SW Contact:  Allayne Butcher, RN Phone Number: 09/06/2021, 12:37 PM   Clinical Narrative:    Patient is discharging home with his mother.  EMS non emergent transport will take patient home, they live in a 3rd floor apartment.  Patient has follow up appointments with Pcp at [facility], and ortho, mother has everything written down in a folder.   Provided them with [facility] book of free and low cost healthcare and an [facility] application just in case they do not want to continue with [facility], patient does not currently have insurance, his mother is going to try and get him on her policy at next open enrollment in Sept.     Final next level of care: Home/Self Care Barriers to Discharge: Barriers Resolved   Patient Goals and CMS Choice Patient states their goals for this hospitalization and ongoing recovery are:: patient agrees to go home with his mother      Discharge Placement                       Discharge Plan and Services   Discharge Planning Services: CM Consult            DME Arranged: N/A         HH Arranged: NA          Social Determinants of Health (SDOH) Interventions     Readmission Risk Interventions     No data to display

## 2021-09-06 NOTE — ED Notes (Addendum)
Rounding-Patient asleep, respirations noted, bed in lowest position with side rail up, call bell within reach 

## 2021-09-06 NOTE — ED Notes (Signed)
APS personnel at bedside.

## 2021-09-06 NOTE — ED Provider Notes (Signed)
Patient cleared for discharge by Child psychotherapist.  Discharged in stable condition.  APS with patient in emergency room prior to discharge.   Gilles Chiquito, MD 09/06/21 [phone]
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
## 2021-12-01 ENCOUNTER — Emergency Department
Admission: EM | Admit: 2021-12-01 | Discharge: 2021-12-01 | Disposition: A | Discharge status: Home / Self Care | Payer: Self-pay | Attending: Emergency Medicine | Admitting: Emergency Medicine

## 2021-12-01 ENCOUNTER — Emergency Department: Payer: Self-pay

## 2021-12-01 ENCOUNTER — Encounter: Payer: Self-pay | Admitting: Emergency Medicine

## 2021-12-01 DIAGNOSIS — R519 Headache, unspecified: Secondary | ICD-10-CM | POA: Insufficient documentation

## 2021-12-01 MED ORDER — SODIUM CHLORIDE 0.9 % IV BOLUS
1000.0000 mL | Freq: Once | INTRAVENOUS | Status: AC
  Administered 2021-12-01: 1000 mL via INTRAVENOUS

## 2021-12-01 MED ORDER — KETOROLAC TROMETHAMINE 30 MG/ML IJ SOLN
30.0000 mg | Freq: Once | INTRAMUSCULAR | Status: AC
  Administered 2021-12-01: 30 mg via INTRAVENOUS
  Filled 2021-12-01: qty 1

## 2021-12-01 MED ORDER — METOCLOPRAMIDE HCL 5 MG/ML IJ SOLN
10.0000 mg | Freq: Once | INTRAMUSCULAR | Status: AC
  Administered 2021-12-01: 10 mg via INTRAVENOUS
  Filled 2021-12-01: qty 2

## 2021-12-01 MED ORDER — DIPHENHYDRAMINE HCL 50 MG/ML IJ SOLN
50.0000 mg | Freq: Once | INTRAMUSCULAR | Status: AC
  Administered 2021-12-01: 50 mg via INTRAVENOUS
  Filled 2021-12-01: qty 1

## 2021-12-01 MED ORDER — BUTALBITAL-APAP-CAFFEINE 50-325-40 MG PO TABS: 1.0000 | ORAL_TABLET | Freq: Four times a day (QID) | ORAL | 0 refills | Status: AC | PRN
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
## 2021-12-01 NOTE — ED Notes (Signed)
Rx to pharmacy, follow up pcp,
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
## 2023-05-04 IMAGING — CT CT ANGIO CHEST
2 of 6 series · 19 of 46 positions shown · IV contrast (APPLIED)
Comparison: None.

CLINICAL DATA: Chest pain, pleuritic

EXAM:
CT ANGIOGRAPHY CHEST WITH CONTRAST
TECHNIQUE: Multidetector CT imaging of the chest was performed using the
standard protocol during bolus administration of intravenous
contrast. Multiplanar CT image reconstructions and MIPs were
obtained to evaluate the vascular anatomy.
CONTRAST:  75mL OMNIPAQUE IOHEXOL 350 MG/ML SOLN

[Series 6: thins · axial · 0.63mm/px · z∈[-295,-30]mm · 16 of 364 slices shown]
[im 16/364  lung]
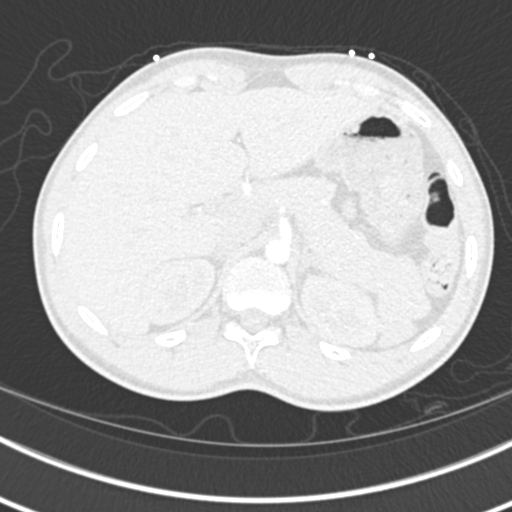
[im 46/364  soft-tissue]
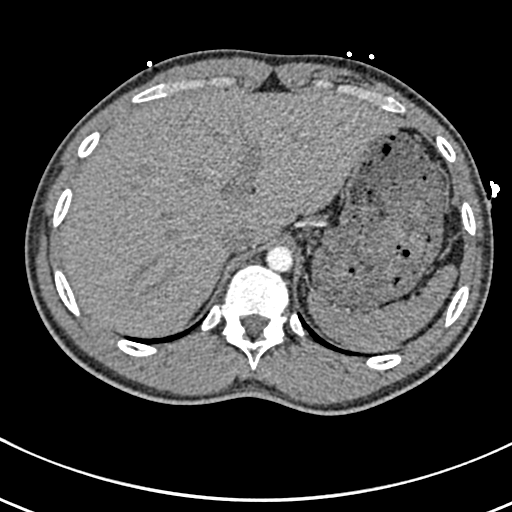
[im 61/364  lung]
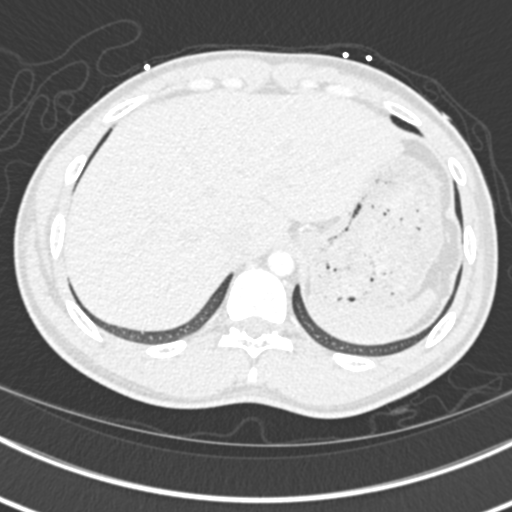
[im 91/364  soft-tissue]
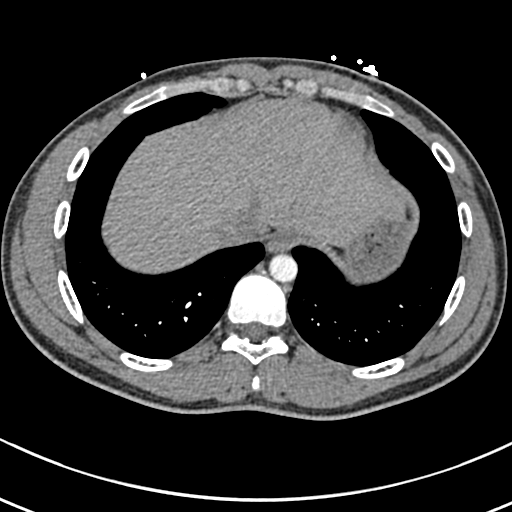
[im 106/364  lung]
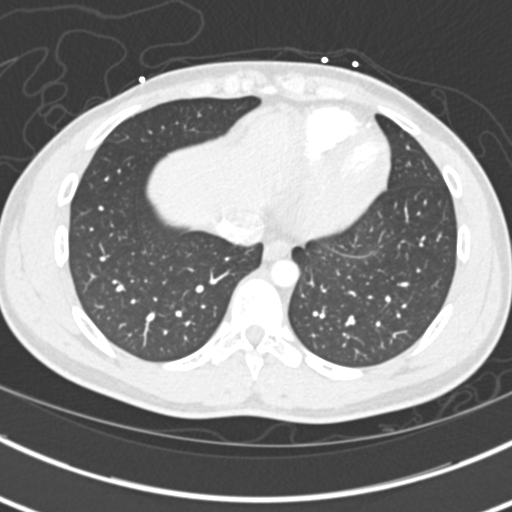
[im 122/364  soft-tissue]
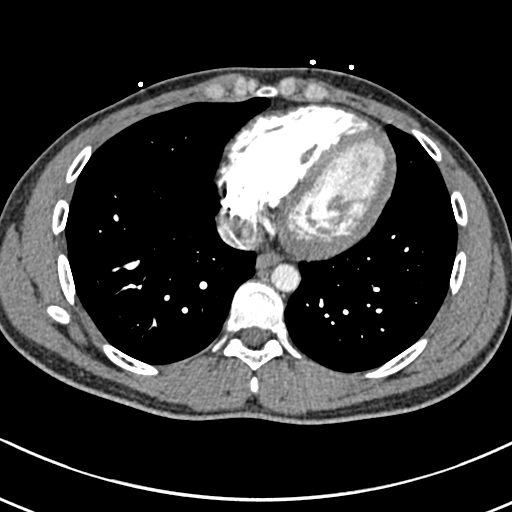
[im 152/364  lung]
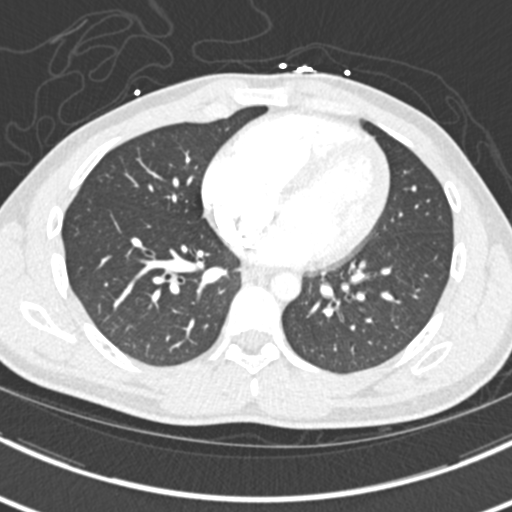
[im 167/364  soft-tissue]
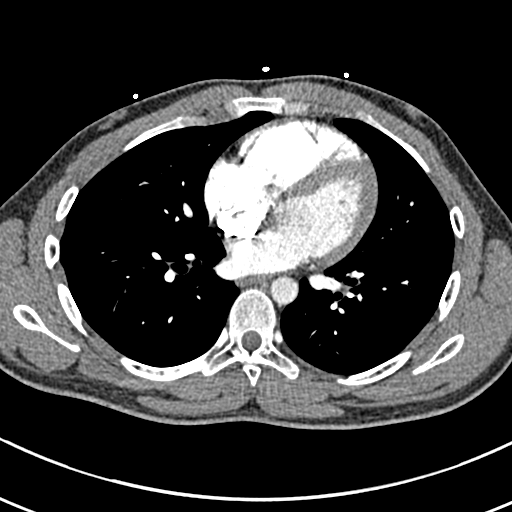
[im 197/364  lung]
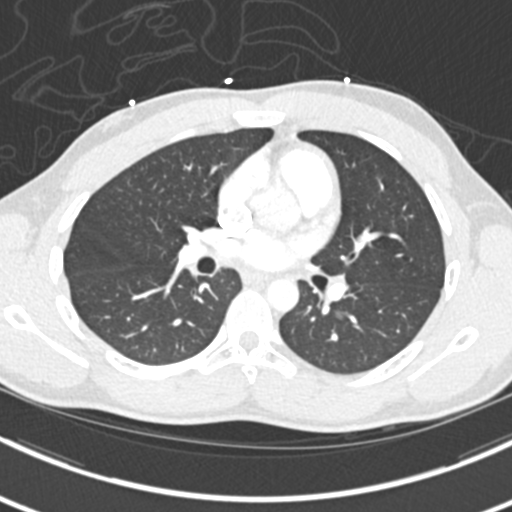
[im 212/364  soft-tissue]
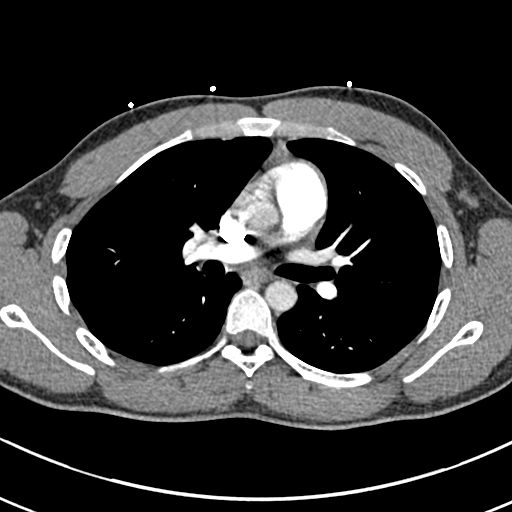
[im 243/364  lung]
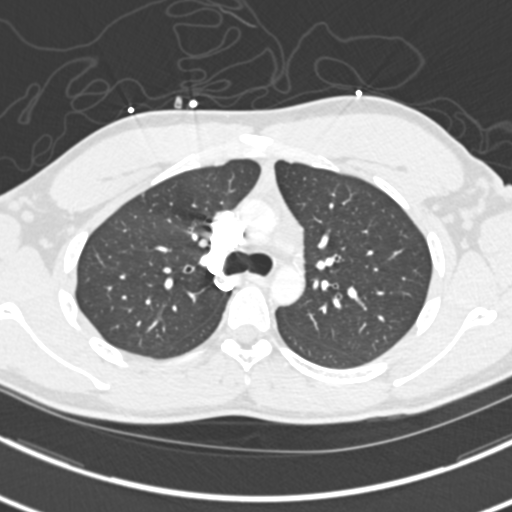
[im 258/364  soft-tissue]
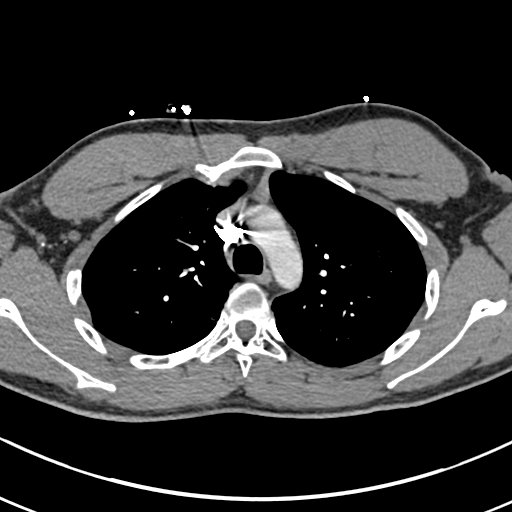
[im 273/364  lung]
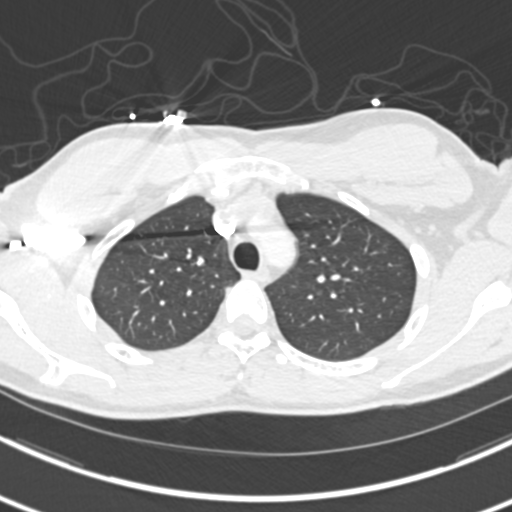
[im 303/364  soft-tissue]
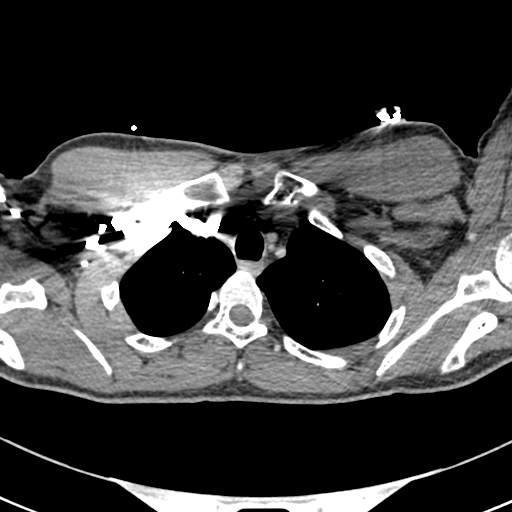
[im 318/364  lung]
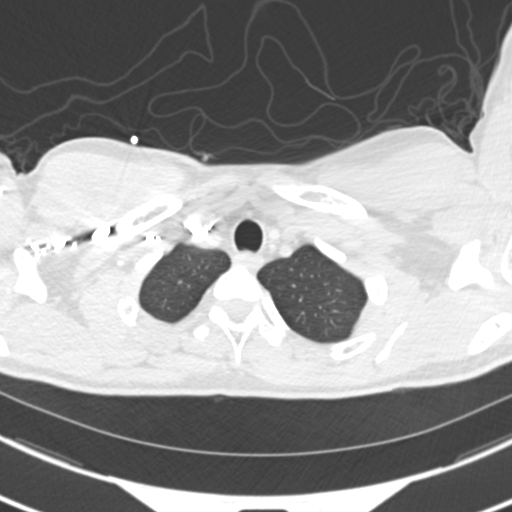
[im 348/364  soft-tissue]
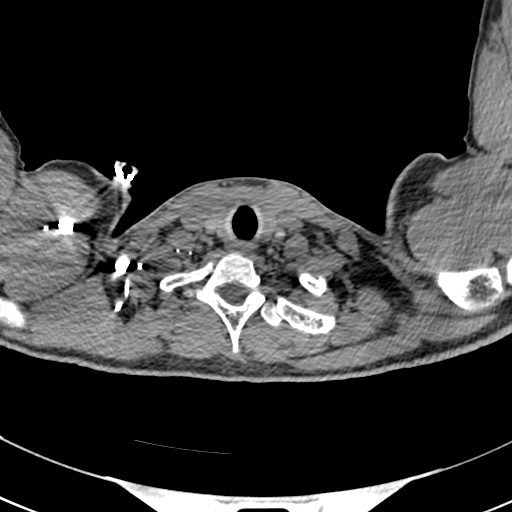

[Series 8: coronal mpr · coronal · 0.57mm/px · 3 of 82 slices shown]
[im 21/82  soft-tissue]
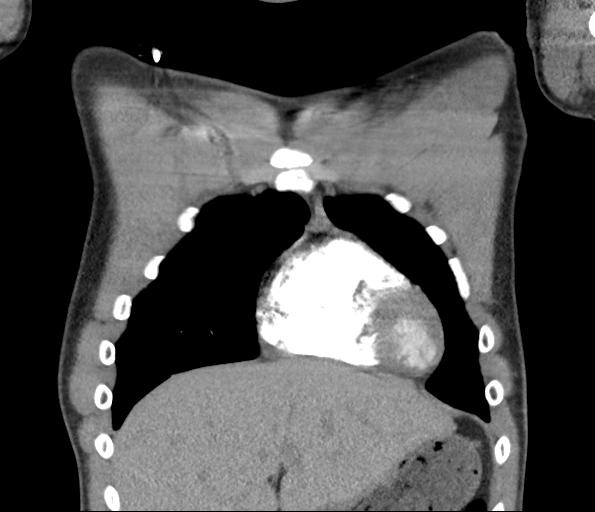
[im 41/82  soft-tissue]
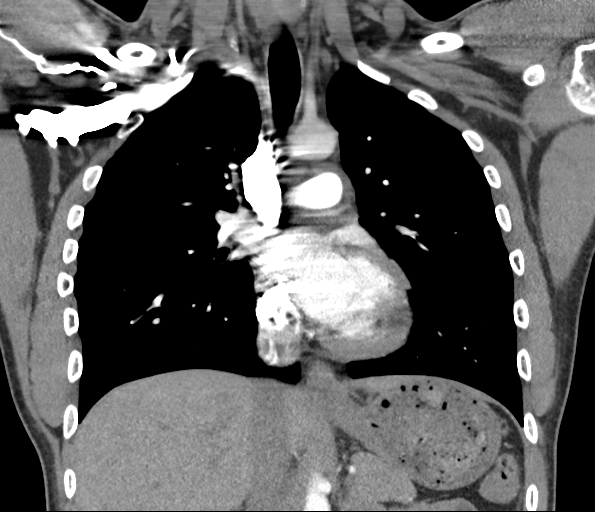
[im 61/82  soft-tissue]
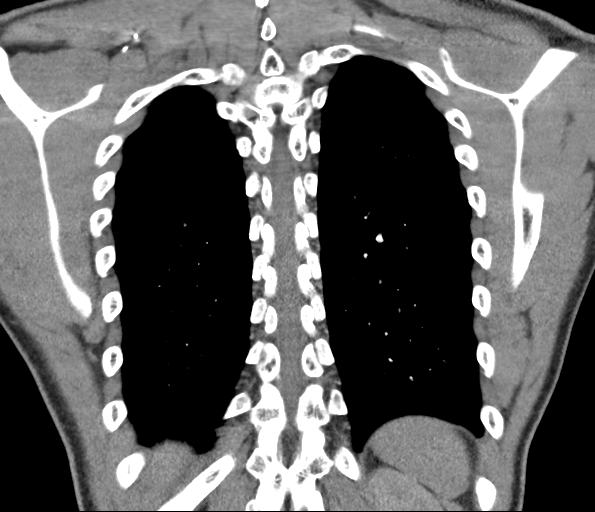

[19 of 46 positions shown; findings below may reference images not displayed]

FINDINGS: Cardiovascular: Satisfactory opacification of the pulmonary arteries
to the segmental level. No evidence of pulmonary embolism. Normal
heart size. No pericardial effusion.

Mediastinum/Nodes: Residual thymus with normal size and shape for
age. No adenopathy or mass

Lungs/Pleura: Cavitary nodule in the left lower lobe measuring 6 mm,
solitary.

Upper Abdomen: Negative

Musculoskeletal: Gynecomastia.  Negative for fracture or bone lesion

Review of the MIP images confirms the above findings.
IMPRESSION: 1. Negative for pulmonary embolism.
2. 6 mm cavitary nodule in the left lower lobe, nonspecific in
isolation. [place] follow-up criteria do not apply to patients of
this age. Fortunately, in retrospect a nodule is seen in this
location on preceding chest x-ray, recommend radiographic follow-up.

## 2023-05-04 IMAGING — CR DG CHEST 2V
1 series · 2 of 2 positions shown · non-contrast
Comparison: None.

CLINICAL DATA: Chest pain on the left

EXAM:
CHEST - 2 VIEW

[Series 1: dg chest 2 view · 0.14mm/px · 2 of 2 slices shown]
[im 1/2]
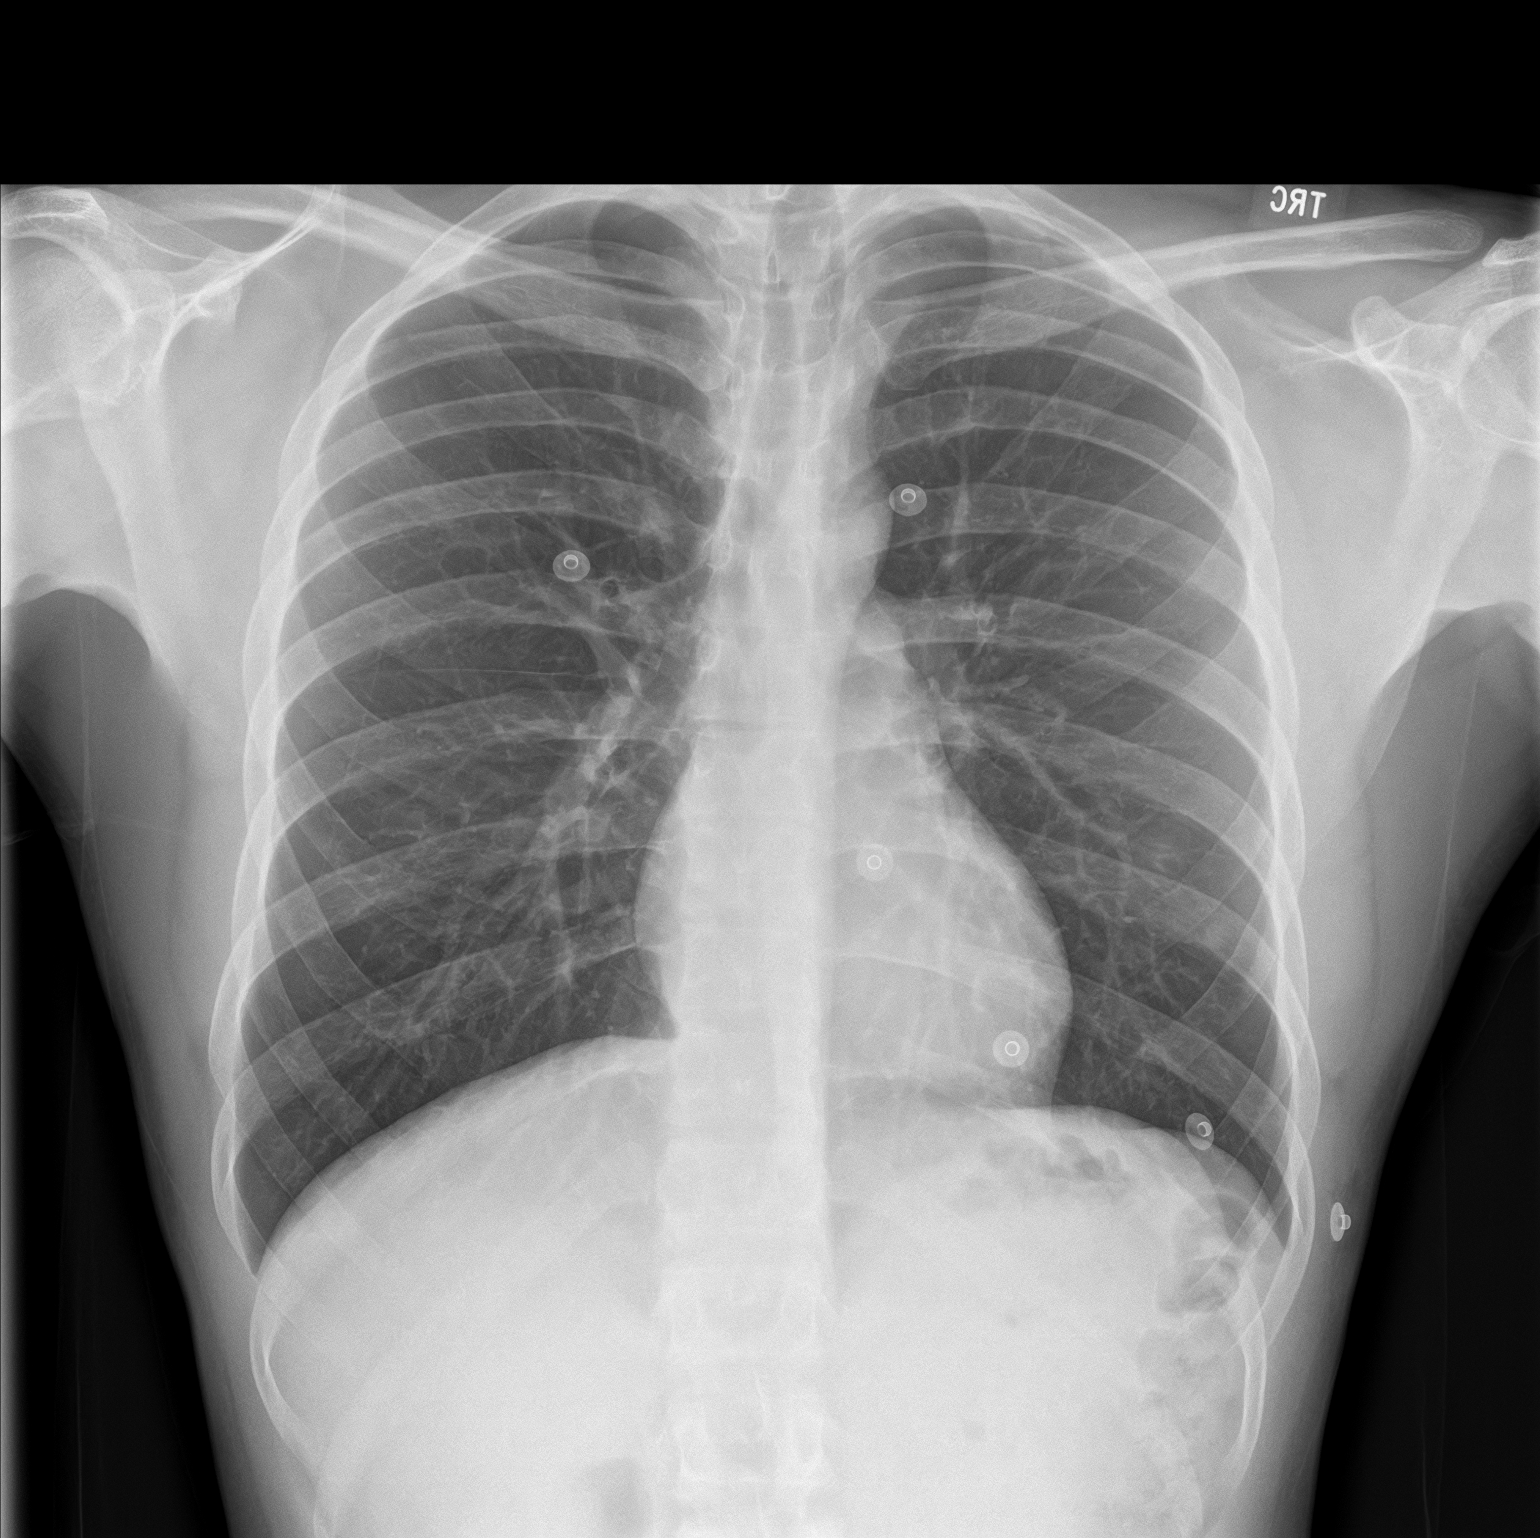
[im 2/2]
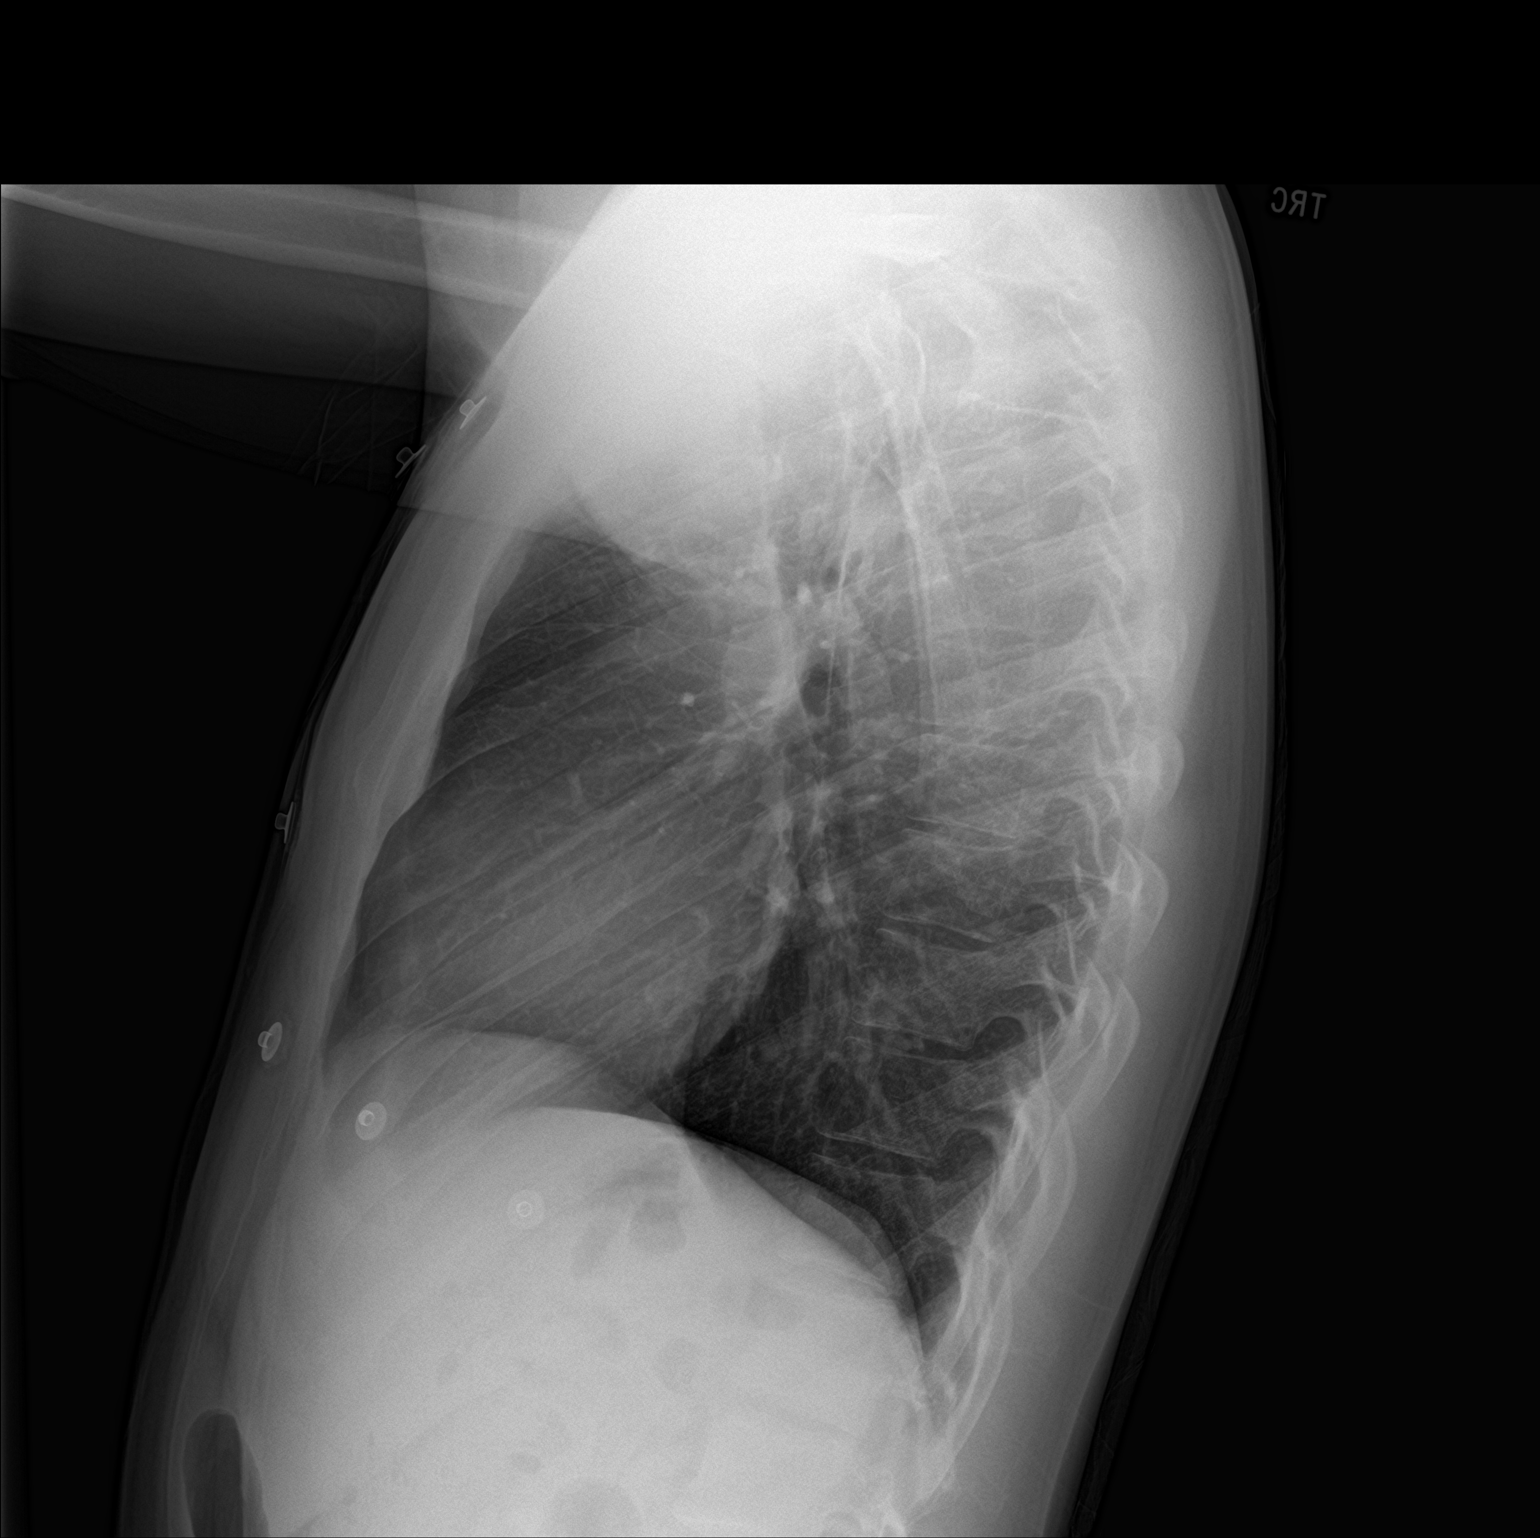

[2 of 2 positions shown; findings below may reference images not displayed]

FINDINGS: The heart size and mediastinal contours are within normal limits.
Both lungs are clear. The visualized skeletal structures are
unremarkable.
IMPRESSION: No active cardiopulmonary disease.

## 2023-06-26 DEATH — deceased
